# Patient Record
Sex: Male | Born: 1990 | Hispanic: Yes | Marital: Single | Smoking: Former smoker
Health system: Southern US, Community
[De-identification: ages and names within clinical notes are randomized; demographics above are authoritative.]

---
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
## 2010-12-21 LAB — DIFFERENTIAL
Basophils Absolute: 0.1
Basophils Relative: 1
Eosinophils Absolute: 0.2
Lymphocytes Relative: 31
Monocytes Absolute: 0.6
Neutrophils Relative %: 60

## 2010-12-21 LAB — I-STAT 8, (EC8 V) (CONVERTED LAB)
Acid-Base Excess: 3 — ABNORMAL HIGH
Glucose, Bld: 88
Hemoglobin: 16
Potassium: 3.8
TCO2: 30
pCO2, Ven: 49.4

## 2010-12-21 LAB — COMPREHENSIVE METABOLIC PANEL
ALT: 19
Alkaline Phosphatase: 63
BUN: 4 — ABNORMAL LOW
CO2: 30
Chloride: 104
Glucose, Bld: 91
Potassium: 3.8
Sodium: 140
Total Protein: 6.9

## 2010-12-21 LAB — URINALYSIS, ROUTINE W REFLEX MICROSCOPIC
Bilirubin Urine: NEGATIVE
Protein, ur: NEGATIVE
pH: 8

## 2010-12-21 LAB — CBC
MCHC: 34
Platelets: 257

## 2010-12-21 LAB — POCT I-STAT CREATININE
Creatinine, Ser: 0.9
Operator id: 261381

## 2010-12-21 LAB — URINE CULTURE

## 2011-06-05 ENCOUNTER — Ambulatory Visit: Payer: Self-pay

## 2013-04-03 ENCOUNTER — Ambulatory Visit (INDEPENDENT_AMBULATORY_CARE_PROVIDER_SITE_OTHER): Payer: BC Managed Care – PPO | Admitting: Family Medicine

## 2013-04-03 ENCOUNTER — Ambulatory Visit: Payer: BC Managed Care – PPO

## 2013-04-03 VITALS — BP 130/68 | HR 82 | Temp 98.0°F | Resp 16 | Ht 68.0 in | Wt 212.0 lb

## 2013-04-03 DIAGNOSIS — M25562 Pain in left knee: Secondary | ICD-10-CM

## 2013-04-03 DIAGNOSIS — M79609 Pain in unspecified limb: Secondary | ICD-10-CM

## 2013-04-03 DIAGNOSIS — M79645 Pain in left finger(s): Secondary | ICD-10-CM

## 2013-04-03 DIAGNOSIS — M25512 Pain in left shoulder: Secondary | ICD-10-CM

## 2013-04-03 DIAGNOSIS — M67919 Unspecified disorder of synovium and tendon, unspecified shoulder: Secondary | ICD-10-CM

## 2013-04-03 DIAGNOSIS — M25569 Pain in unspecified knee: Secondary | ICD-10-CM

## 2013-04-03 DIAGNOSIS — M25519 Pain in unspecified shoulder: Secondary | ICD-10-CM

## 2013-04-03 DIAGNOSIS — M7582 Other shoulder lesions, left shoulder: Secondary | ICD-10-CM

## 2013-04-03 DIAGNOSIS — M719 Bursopathy, unspecified: Secondary | ICD-10-CM

## 2013-04-03 LAB — POCT CBC
GRANULOCYTE PERCENT: 64.8 % (ref 37–80)
HCT, POC: 50.1 % (ref 43.5–53.7)
HEMOGLOBIN: 15.9 g/dL (ref 14.1–18.1)
LYMPH, POC: 2.8 (ref 0.6–3.4)
MCH: 31.7 pg — AB (ref 27–31.2)
MCHC: 31.7 g/dL — AB (ref 31.8–35.4)
MCV: 100 fL — AB (ref 80–97)
MID (cbc): 0.6 (ref 0–0.9)
MPV: 8.6 fL (ref 0–99.8)
PLATELET COUNT, POC: 301 10*3/uL (ref 142–424)
POC Granulocyte: 6.3 (ref 2–6.9)
POC LYMPH %: 28.9 % (ref 10–50)
POC MID %: 6.3 %M (ref 0–12)
RBC: 5.01 M/uL (ref 4.69–6.13)
RDW, POC: 15.1 %
WBC: 9.7 10*3/uL (ref 4.6–10.2)

## 2013-04-03 MED ORDER — MELOXICAM 7.5 MG PO TABS: 7.5000 mg | ORAL_TABLET | Freq: Every day | ORAL | Status: DC

## 2013-04-03 MED ORDER — TRAMADOL HCL 50 MG PO TABS: 50.0000 mg | ORAL_TABLET | Freq: Three times a day (TID) | ORAL | Status: DC | PRN

## 2013-04-03 NOTE — Patient Instructions (Addendum)
Rest and use ice for your knee and shoulder.   If possible take the next week off of work.  Avoid any overhead maneuvers while lifting weights or working.  Try to do internal and external rotation exercises with your exercise band a couple of times a day.   Use the splint for your thumb when possible.  If your knee is not better in the next couple of days or if it is getting worse please let us know right away Take mobic as needed for inflammation and tramadol as needed for pain.

## 2013-04-03 NOTE — Progress Notes (Signed)
[facility] [address] [place] [address] [phone]  Date:  04/03/2013   Name:  Terry Barber   DOB:  11-Feb-1991   MRN:  [id number]  PCP:  No primary provider on file.    Chief Complaint: Shoulder Pain, Hand Pain and Knee Pain   History of Present Illness:  Terry Barber is a 23 y.o. very pleasant male patient who presents with the following:  Here today as a new patient with some MSK issues.  He has noted left knee pain for "a long time," worse for the last 6 months or so.   He has been out of town a lot for work so it is hard for him to seek medical attention.  He lives in [place] but is rarely here.   He is working out at [employer] and [employer]- this seems to make his knee worse.   He has lost about 30 lbs and hoped this would help- it did for awhile but then his sx came back.  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 Has to work in awkward positions.  He did not have any particular injury except when he hyperextended his knee in HS- it got better on it's own He has not noted and fever or flu like sx, no injection to the knee, no heat or redness.   He also notes a problem with his left shoulder.  He thinks this stems from a HS football injury.  He has pain when he does bench press and this can radiate down his arm.  Overhead press is painful. He hurts worse after doing chest and shoulder weightlifting sessions, or when doing overhead work at this job.  He has has this shoulder issue for about 2 months.    He also has a left thumb injury. He was using a drill when the drill caught and jerked his thumb.  It seemed to injure one of the extensor tendons of his thumb.  This happened 6 months ago. The thumb did swell but he was able to move it ok.  It is now a lot better but still sore with certain movements   He is generally healthy.   He has tried ibuprofen for his injuries.    There are no  active problems to display for this patient.   History reviewed. No pertinent past medical history.  History reviewed. No pertinent past surgical history.  History  Substance Use Topics  . Smoking status: Never Smoker   . Smokeless tobacco: Not on file  . Alcohol Use: No    History reviewed. No pertinent family history.  No Known Allergies  Medication list has been reviewed and updated.  No current outpatient prescriptions on file prior to visit.   No current facility-administered medications on file prior to visit.    Review of Systems:  As per HPI- otherwise negative.   Physical Examination: Filed Vitals:   04/03/13 1734  BP: 130/68  Pulse: 82  Temp: 98 F (36.7 C)  Resp: 16   Filed Vitals:   04/03/13 1734  Height: 5\' 8"  (1.727 m)  Weight: 212 lb (96.163 kg)   Body mass index is 32.24 kg/(m^2). Ideal Body Weight: Weight in (lb) to have BMI = 25: 164.1  GEN: WDWN, NAD, Non-toxic, A & O x 3, muscular build, looks well HEENT: Atraumatic, Normocephalic. Neck supple. No masses, No LAD. Ears and Nose: No  external deformity. CV: RRR, No M/G/R. No JVD. No thrill. No extra heart sounds. PULM: CTA B, no wheezes, crackles, rhonchi. No retractions. No resp. distress. No accessory muscle use. EXTR: No c/c/e NEURO Normal gait.  PSYCH: Normally interactive. Conversant. Not depressed or anxious appearing.  Calm demeanor.  Left knee: full ROM.  No effusion, ligaments are stable. At first exam knee was slightly warm, but pt admitted he had been rubbing and working on the knee to try to help the pain.  Rechecked after films done and warmth resolved.   Left shoulder: full ROM.  Pain with external and internal rotation and with abduction. No weakness.  Negative empty can test Left thumb: no appreciable swelling,  Normal strength and ROM.  He is slightly tender over the MCP joint  [facility] reading (PRIMARY) by  Dr. Patsy Lager. Left hand: negative Left shoulder: negative Left knee:  negative  LEFT KNEE - COMPLETE 4+ VIEW  COMPARISON: None.  FINDINGS: There is no evidence of fracture, dislocation, or joint effusion. There is no evidence of arthropathy or other focal bone abnormality. Soft tissues are unremarkable.  IMPRESSION: Negative.  LEFT SHOULDER - 2+ VIEW  COMPARISON: None.  FINDINGS: There is no evidence of fracture or dislocation. There is no evidence of arthropathy or other focal bone abnormality. Soft tissues are unremarkable. IMPRESSION:  Negative.  EXAM: LEFT HAND - COMPLETE 3+ VIEW  COMPARISON: None.  FINDINGS: There is no evidence of fracture or dislocation. There is no evidence of arthropathy or other focal bone abnormality. Soft tissues are unremarkable.  IMPRESSION: Negative.   Results for orders placed in visit on 04/03/13  POCT CBC      Result Value Range   WBC 9.7  4.6 - 10.2 K/uL   Lymph, poc 2.8  0.6 - 3.4   POC LYMPH PERCENT 28.9  10 - 50 %L   MID (cbc) 0.6  0 - 0.9   POC MID % 6.3  0 - 12 %M   POC Granulocyte 6.3  2 - 6.9   Granulocyte percent 64.8  37 - 80 %G   RBC 5.01  4.69 - 6.13 M/uL   Hemoglobin 15.9  14.1 - 18.1 g/dL   HCT, POC 16.1  09.6 - 53.7 %   MCV 100.0 (*) 80 - 97 fL   MCH, POC 31.7 (*) 27 - 31.2 pg   MCHC 31.7 (*) 31.8 - 35.4 g/dL   RDW, POC 04.5     Platelet Count, POC 301  142 - 424 K/uL   MPV 8.6  0 - 99.8 fL    Assessment and Plan: Pain in left knee - Plan: DG Knee Complete 4 Views Left, POCT CBC, Uric Acid, meloxicam (MOBIC) 7.5 MG tablet, traMADol (ULTRAM) 50 MG tablet  Pain of left thumb - Plan: DG Hand Complete Left  Pain in left shoulder - Plan: DG Shoulder Left  Tendonitis of left rotator cuff  Terry Barber is here today with a few MSK issues.  He seems to have overuse injuries of his left knee and left shoulder, and a thumb sprain that is now improved. Thumb spica splint to left thumb Hinged knee brace Discussed movements to avoid to protect his shoulder, RC strengthening  exercises.   As he plans to stay in town next week he would like to see ortho.  I will arrange for his.   Cautioned regarding his knee: at this time I do not believe he has a joint infection.  However if heat returns, the  pain gets worse or he has any fever or flu- like sx he needs to seek care immediately  mobic and tramadol as needed  Signed Abbe AmsterdamJessica Catlin Aycock, MD

## 2013-04-04 LAB — URIC ACID: Uric Acid, Serum: 4.6 mg/dL (ref 4.0–7.8)

## 2013-04-06 ENCOUNTER — Telehealth: Payer: Self-pay | Admitting: *Deleted

## 2013-04-06 DIAGNOSIS — M25562 Pain in left knee: Secondary | ICD-10-CM

## 2013-04-06 NOTE — Telephone Encounter (Signed)
I have called [facility] and they can see patient on Wed morning at 10:45 , have left message for him to call me back, to you FYI in case you get the call.

## 2013-07-31 ENCOUNTER — Ambulatory Visit (HOSPITAL_COMMUNITY): Payer: Self-pay | Attending: Internal Medicine

## 2013-07-31 ENCOUNTER — Ambulatory Visit (INDEPENDENT_AMBULATORY_CARE_PROVIDER_SITE_OTHER): Payer: BC Managed Care – PPO | Admitting: Internal Medicine

## 2013-07-31 VITALS — BP 124/76 | HR 86 | Temp 98.4°F | Resp 17 | Ht 68.5 in | Wt 218.0 lb

## 2013-07-31 DIAGNOSIS — N508 Other specified disorders of male genital organs: Secondary | ICD-10-CM

## 2013-07-31 DIAGNOSIS — R059 Cough, unspecified: Secondary | ICD-10-CM

## 2013-07-31 DIAGNOSIS — N5089 Other specified disorders of the male genital organs: Secondary | ICD-10-CM

## 2013-07-31 DIAGNOSIS — J45909 Unspecified asthma, uncomplicated: Secondary | ICD-10-CM

## 2013-07-31 DIAGNOSIS — R05 Cough: Secondary | ICD-10-CM

## 2013-07-31 MED ORDER — PREDNISONE 20 MG PO TABS: ORAL_TABLET | ORAL | Status: DC

## 2013-07-31 MED ORDER — CETIRIZINE HCL 10 MG PO TABS: 10.0000 mg | ORAL_TABLET | Freq: Every day | ORAL | Status: DC

## 2013-07-31 NOTE — Progress Notes (Signed)
Subjective:    Patient ID: Terry Barber, male    DOB: 11/18/1990, 23 y.o.   MRN: [id number] This chart was scribed for Terry Sia, MD by Valera Castle, ED Scribe. This patient was seen in room 05 and the patient's care was started at 3:38 PM.  Chief Complaint  Patient presents with  . Testicle Pain   HPI [name] is a 23 y.o. male Pt presents with right testicle pain.   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 He reports going to a MD, states he was not diagnosed with a hernia, had a normal urinalysis , and He reports being put on an antibiotic and Naproxen. He states the swelling has gone down, but he then noticed a lump while palpating his testicle. He states the lump has subsided some since then, but states the lump is still present and still tender. He denies h/o hernia. There is no dysuria or frequency and no penile discharge. He has no new partners.  He reports cough, with associated itching eyes and rhinorrhea, onset 3 weeks ago. He denies h/o asthma and allergies. He reports he works in an area with a lot of dust floating around.    PCP - No PCP Per Patient  There are no active problems to display for this patient.  Prior to Admission medications   Not on File   Review of Systems  Constitutional: Negative for fever.  HENT: Positive for rhinorrhea.   Eyes: Positive for itching.  Respiratory: Positive for cough.   Genitourinary: Positive for scrotal swelling and testicular pain (right).      Objective:   Physical Exam  Nursing note and vitals reviewed. Constitutional: He is oriented to person, place, and time. He appears well-developed and well-nourished. No distress.  HENT:  Head: Normocephalic and atraumatic.  Eyes: Conjunctivae and EOM are normal. Pupils are equal, round, and reactive to light.  Neck: Neck supple. No thyromegaly present.  Cardiovascular: Normal rate, regular  rhythm and normal heart sounds.   No murmur heard. Pulmonary/Chest: Effort normal. No respiratory distress. He has wheezes. He has no rales.  Bilateral wheezing on forced expiration.   Genitourinary:  No inguinal hernia right or left. Right testicle has firm mass occupying lower 1/3. And a small enlargement of the epididymal head with some tenderness.   Musculoskeletal: Normal range of motion.  Lymphadenopathy:    He has no cervical adenopathy.  Neurological: He is alert and oriented to person, place, and time.  Skin: Skin is warm and dry.  Psychiatric: He has a normal mood and affect. His behavior is normal.   BP 124/76  Pulse 86  Temp(Src) 98.4 F (36.9 C) (Oral)  Resp 17  Ht 5' 8.5" (1.74 m)  Wt 218 lb (98.884 kg)  BMI 32.66 kg/m2  SpO2 97%     Assessment & Plan:  Testicular mass - Plan: US Scrotum  Center record [facility] long for this procedure  Cough secondary to Reactive airway disease/allergic rhinitis  Recheck after medications Meds ordered this encounter  Medications  . predniSONE (DELTASONE) 20 MG tablet    Sig: 3/3/2/2/1/1 single daily dose for 6 days    Dispense:  12 tablet    Refill:  0  . cetirizine (ZYRTEC) 10 MG tablet    Sig: Take 1 tablet (10 mg total) by mouth daily.    Dispense:  30 tablet    Refill:  0  I have completed the patient encounter in its entirety as documented by the scribe, with editing by me where necessary. Esta Carmon P. Barber Pastor, M.D.

## 2013-08-03 ENCOUNTER — Telehealth: Payer: Self-pay | Admitting: Family Medicine

## 2013-08-03 NOTE — Telephone Encounter (Signed)
I tried to call patient. His home number is not working and his cell number has a voice mail that has not been set up.  I was unable to reach him.

## 2013-08-03 NOTE — Telephone Encounter (Signed)
Message copied by Honor Loh on Mon Aug 03, 2013  9:14 AM ------      Message from: Tonye Pearson      Created: Sat Aug 01, 2013  2:35 PM       I don't see results of his testicular Llana Aliment may not have gone the [place] long as directed--- would you call and follow up on this tomorrow ------

## 2014-10-27 ENCOUNTER — Emergency Department (HOSPITAL_COMMUNITY)
Admission: EM | Admit: 2014-10-27 | Discharge: 2014-10-27 | Disposition: A | Discharge status: Home / Self Care | Payer: BLUE CROSS/BLUE SHIELD | Attending: Emergency Medicine | Admitting: Emergency Medicine

## 2014-10-27 ENCOUNTER — Encounter (HOSPITAL_COMMUNITY): Payer: Self-pay | Admitting: Emergency Medicine

## 2014-10-27 DIAGNOSIS — T401X1A Poisoning by heroin, accidental (unintentional), initial encounter: Secondary | ICD-10-CM | POA: Diagnosis present

## 2014-10-27 DIAGNOSIS — Z720 Tobacco use: Secondary | ICD-10-CM | POA: Insufficient documentation

## 2014-10-27 DIAGNOSIS — F1110 Opioid abuse, uncomplicated: Secondary | ICD-10-CM | POA: Diagnosis not present

## 2014-10-27 DIAGNOSIS — Y998 Other external cause status: Secondary | ICD-10-CM | POA: Insufficient documentation

## 2014-10-27 DIAGNOSIS — Y9389 Activity, other specified: Secondary | ICD-10-CM | POA: Diagnosis not present

## 2014-10-27 DIAGNOSIS — Y9289 Other specified places as the place of occurrence of the external cause: Secondary | ICD-10-CM | POA: Diagnosis not present

## 2014-10-27 NOTE — ED Notes (Signed)
Pt presents for heroin OD at approximately 1830. EMS gave narcan intranasally. Pt refused EMS transport. Pt c/o HA. Ambulatory upon arrival.

## 2014-10-27 NOTE — ED Provider Notes (Signed)
CSN: [id number]     Arrival date & time 10/27/14  2102 History   First MD Initiated Contact with Patient 10/27/14 2115     No chief complaint on file.    (Consider location/radiation/quality/duration/timing/severity/associated sxs/prior Treatment) Patient is a 24 y.o. male presenting with Overdose. The history is provided by the patient.  Drug Overdose This is a new problem. The current episode started 3 to 5 hours ago. The problem occurs constantly. The problem has been resolved. Pertinent negatives include no chest pain and no abdominal pain. Nothing aggravates the symptoms. Nothing relieves the symptoms. He has tried nothing for the symptoms. The treatment provided no relief.    History reviewed. No pertinent past medical history. History reviewed. No pertinent past surgical history. No family history on file. Social History  Substance Use Topics  . Smoking status: Current Every Day Smoker -- 0.20 packs/day for 1 years    Types: Cigarettes  . Smokeless tobacco: None  . Alcohol Use: No    Review of Systems  Cardiovascular: Negative for chest pain.  Gastrointestinal: Negative for abdominal pain.  All other systems reviewed and are negative.     Allergies  Review of patient's allergies indicates no known allergies.  Home Medications   Prior to Admission medications   Medication Sig Start Date End Date Taking? Authorizing Provider  cetirizine (ZYRTEC) 10 MG tablet Take 1 tablet (10 mg total) by mouth daily. Patient not taking: Reported on 10/27/2014 07/31/13   Tonye Pearson, MD  predniSONE (DELTASONE) 20 MG tablet 3/3/2/2/1/1 single daily dose for 6 days Patient not taking: Reported on 10/27/2014 07/31/13   Tonye Pearson, MD   BP 137/86 mmHg  Pulse 102  Temp(Src) 98.4 F (36.9 C) (Oral)  SpO2 98% Physical Exam  Constitutional: He is oriented to person, place, and time. He appears well-developed and well-nourished. No distress.  HENT:  Head: Normocephalic and  atraumatic.  Eyes: Conjunctivae are normal.  Neck: Neck supple. No tracheal deviation present.  Cardiovascular: Normal rate and regular rhythm.   Pulmonary/Chest: Effort normal. No respiratory distress.  Abdominal: Soft. He exhibits no distension.  Neurological: He is alert and oriented to person, place, and time.  Skin: Skin is warm and dry.  Psychiatric: He has a normal mood and affect.    ED Course  Procedures (including critical care time) Labs Review Labs Reviewed - No data to display  Imaging Review No results found. I have personally reviewed and evaluated these images and lab results as part of my medical decision-making.   EKG Interpretation   Date/Time:  Wednesday October 27 2014 21:46:58 EDT Ventricular Rate:  92 PR Interval:  176 QRS Duration: 89 QT Interval:  362 QTC Calculation: 448 R Axis:   90 Text Interpretation:  Sinus rhythm Early repolarization (normal variant)  Confirmed by Ace Bergfeld MD, Reuel Boom ([id number]) on 10/27/2014 10:06:54 PM      MDM   Final diagnoses:  Heroin abuse    24 year old male presents after having a heroin overdose and being found by EMS, given Narcan in the field with recovery of his symptoms. He was told that he had an abnormal finding on his EKG and that he was recommended to come to the hospital but refused at that time. After a few hours of deliberation he presents for an EKG check. EKG interpreted by me without ST or T wave changes concerning for myocardial ischemia. No delta wave, no prolonged QTc, no brugada to suggest arrhythmogenicity.  He has a normal variant  EKG ST and T-wave finding. I explained that it is more concerning that he is abusing heroin and recommended treatment. Plan to follow up with PCP as needed and return precautions discussed for worsening or new concerning symptoms.     Lyndal Pulley, MD 10/28/14 [phone]

## 2014-10-27 NOTE — Discharge Instructions (Signed)
Emergency Department Resource Guide 1) Find a Doctor and Pay Out of Pocket Although you won't have to find out who is covered by your insurance plan, it is a good idea to ask around and get recommendations. You will then need to call the office and see if the doctor you have chosen will accept you as a new patient and what types of options they offer for patients who are self-pay. Some doctors offer discounts or will set up payment plans for their patients who do not have insurance, but you will need to ask so you aren't surprised when you get to your appointment.  2) Contact Your Local Health Department Not all health departments have doctors that can see patients for sick visits, but many do, so it is worth a call to see if yours does. If you don't know where your local health department is, you can check in your phone book. The CDC also has a tool to help you locate your state's health department, and many state websites also have listings of all of their local health departments.  3) Find a Walk-in Clinic If your illness is not likely to be very severe or complicated, you may want to try a walk in clinic. These are popping up all over the country in pharmacies, drugstores, and shopping centers. They're usually staffed by nurse practitioners or physician assistants that have been trained to treat common illnesses and complaints. They're usually fairly quick and inexpensive. However, if you have serious medical issues or chronic medical problems, these are probably not your best option.  No Primary Care Doctor: - Call Health Connect at  [phone] - they can help you locate a primary care doctor that  accepts your insurance, provides certain services, etc. - Physician Referral Service- [phone]   Behavioral Health Resources in the Community: Intensive Outpatient Programs Organization         Address  Phone  Notes  [facility] [address], [place], [place]  [phone]   [facility] [address] [place], [place] [phone]   [facility] [address] [place], [place]  [phone]   [facility] [address]  [place], [place] [phone] or [phone]   Substance Abuse Resources Organization         Address  Phone  Notes  [facility]  [phone]   [facility]  [phone]   [facility]  [phone]   [facility] Flock  ([phone]   Residential & Outpatient Substance Abuse Program  [phone]      Self-Help/Support Groups Organization         Address  Phone             Notes  [facility] [place] - variety of support groups  336- I7437963 Call for more information  Narcotics Anonymous (NA), [facility] [address], [employer] [place]  2 meetings at this location   Statistician         Address  Phone  Notes  [facility] [address],    [place]  [phone]   [facility]  [address] [place] [address], [place], [place] [phone]   [facility] [address] [place], [place] [phone] Admissions: 8am-3pm M-F  [facility] [address]    [place], [place] [phone]   [facility] [address] [place], [place] [phone]   [facility] [address],  [place], [place] [phone]   [facility] - Intensive Outpatient [address], [place], [place] [phone]   [facility] [address] [place]  [place], [place] [phone] or [phone]   [facility] ([facility] [address], [place], [place] [phone] Accepts Medicaid  [facility] [address]  [place] [phone] Substance Abuse/Addiction Treatment

## 2015-05-13 ENCOUNTER — Ambulatory Visit (INDEPENDENT_AMBULATORY_CARE_PROVIDER_SITE_OTHER): Payer: BLUE CROSS/BLUE SHIELD | Admitting: Physician Assistant

## 2015-05-13 VITALS — BP 114/62 | HR 73 | Temp 98.0°F | Resp 16 | Ht 68.0 in | Wt 224.0 lb

## 2015-05-13 DIAGNOSIS — M25562 Pain in left knee: Secondary | ICD-10-CM | POA: Diagnosis not present

## 2015-05-13 MED ORDER — DICLOFENAC SODIUM 75 MG PO TBEC: 75.0000 mg | DELAYED_RELEASE_TABLET | Freq: Two times a day (BID) | ORAL | Status: DC

## 2015-05-13 NOTE — Patient Instructions (Signed)
Called [facility] - Dr Zachery DauerBarnes - [phone] - to schedule an appt for evaluation and discussed on his MRI result

## 2015-05-13 NOTE — Progress Notes (Signed)
   Terry Barber  MRN: [id number] DOB: 09/30/1990  Subjective:  Pt presents to clinic with continued pain in his left knee. He had multiple knee injuries while playing football in school.  He has pain all day everyday - it bothers him while he is sleeping - if the leg is straight he has more pain.  2 years [facility] and he was given an injection in the knee but it did not help, he thinks he got an MRI but he never got the results.  He uses Motrin 400mg  bid (am and then off of work) which does not really help. 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  He feels like his knee is going to give away but never has and it pops a lot.  He has no locking or clicking.  Called and spoke with Clydie [facility]Karen at [facility].  MRI was done but the patient never came into get the results - Dr Zachery DauerBarnes is who he saw.   Patient Active Problem List   Diagnosis Date Noted  . Left knee pain 05/13/2015    No current outpatient prescriptions on file prior to visit.   No current facility-administered medications on file prior to visit.    No Known Allergies  Review of Systems  Musculoskeletal: Negative for gait problem.   Objective:  BP 114/62 mmHg  Pulse 73  Temp(Src) 98 F (36.7 C) (Oral)  Resp 16  Ht 5\' 8"  (1.727 m)  Wt 224 lb (101.606 kg)  BMI 34.07 kg/m2  SpO2 98%  Physical Exam  Constitutional: He is oriented to person, place, and time and well-developed, well-nourished, and in no distress.  HENT:  Head: Normocephalic and atraumatic.  Right Ear: External ear normal.  Left Ear: External ear normal.  Eyes: Conjunctivae are normal.  Neck: Normal range of motion.  Pulmonary/Chest: Effort normal.  Musculoskeletal:       Right knee: Normal.       Left knee: He exhibits normal range of motion, no ecchymosis, no erythema, no LCL laxity, normal patellar mobility, no bony tenderness, normal meniscus ( neg McMurrys) and no MCL laxity. Tenderness found.  Medial joint line (mild) tenderness noted. No lateral joint line, no MCL, no LCL and no patellar tendon tenderness noted.  Neurological: He is alert and oriented to person, place, and time. Gait normal.  Skin: Skin is warm and dry.  Psychiatric: Mood, memory, affect and judgment normal.    Assessment and Plan :  Left knee pain - Plan: diclofenac (VOLTAREN) 75 MG EC tablet, Care order/instruction   Pt will call and schedule an appt with [facility] to get his MRI results.  He has a normal exam today and if his MRI is normal PT should help him.  Terry LennertSarah Jennet Scroggin PA-C  [facility] 05/13/2015 11:04 AM

## 2017-10-09 ENCOUNTER — Encounter (HOSPITAL_BASED_OUTPATIENT_CLINIC_OR_DEPARTMENT_OTHER): Payer: Self-pay

## 2017-10-09 ENCOUNTER — Emergency Department (HOSPITAL_BASED_OUTPATIENT_CLINIC_OR_DEPARTMENT_OTHER)
Admission: EM | Admit: 2017-10-09 | Discharge: 2017-10-09 | Disposition: A | Discharge status: Home / Self Care | Payer: BLUE CROSS/BLUE SHIELD | Attending: Emergency Medicine | Admitting: Emergency Medicine

## 2017-10-09 ENCOUNTER — Other Ambulatory Visit: Payer: Self-pay

## 2017-10-09 DIAGNOSIS — Y929 Unspecified place or not applicable: Secondary | ICD-10-CM | POA: Insufficient documentation

## 2017-10-09 DIAGNOSIS — F17210 Nicotine dependence, cigarettes, uncomplicated: Secondary | ICD-10-CM | POA: Insufficient documentation

## 2017-10-09 DIAGNOSIS — Y998 Other external cause status: Secondary | ICD-10-CM | POA: Insufficient documentation

## 2017-10-09 DIAGNOSIS — S161XXA Strain of muscle, fascia and tendon at neck level, initial encounter: Secondary | ICD-10-CM

## 2017-10-09 DIAGNOSIS — Y33XXXA Other specified events, undetermined intent, initial encounter: Secondary | ICD-10-CM | POA: Insufficient documentation

## 2017-10-09 DIAGNOSIS — Y939 Activity, unspecified: Secondary | ICD-10-CM | POA: Insufficient documentation

## 2017-10-09 MED ORDER — METHOCARBAMOL 500 MG PO TABS: 500.0000 mg | ORAL_TABLET | Freq: Two times a day (BID) | ORAL | 0 refills | Status: AC

## 2017-10-09 MED ORDER — IBUPROFEN 800 MG PO TABS: 800.0000 mg | ORAL_TABLET | Freq: Three times a day (TID) | ORAL | 0 refills | Status: AC | PRN

## 2017-10-09 NOTE — ED Notes (Addendum)
Pain to left side neck and left upper back, pt last took something for pain yesterday

## 2017-10-09 NOTE — ED Notes (Signed)
Pt verbalizes understanding of d/c instructions and denies any further needs at this time. 

## 2017-10-09 NOTE — ED Provider Notes (Signed)
[facility] EMERGENCY DEPARTMENT Provider Note   CSN: [id number] Arrival date & time: 10/09/17  2113     History   Chief Complaint Chief Complaint  Patient presents with  . Neck Pain    HPI Terry Barber is a 27 y.o. male.  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  He is here today with posterior neck pain on the left side that is been going on for 3 days.  He works [employer] but does not recall any particular injury.  He is tried ibuprofen for this pain but it has not helped.  He is concerned that it will happen like the last time or although once his arm does not work.  His prior injury was on the right side which is different than where his pain is today.  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  The history is provided by the patient.  Neck Pain   This is a new problem. The current episode started more than 2 days ago. The problem occurs constantly. The problem has not changed since onset.The pain is associated with an unknown factor. There has been no fever. The pain is present in the left side. The quality of the pain is described as stabbing. The pain radiates to the left scapula. The pain is moderate. The symptoms are aggravated by twisting and position. The pain is the same all the time. Pertinent negatives include no photophobia, no visual change, no chest pain, no numbness, no headaches, no bowel incontinence, no bladder incontinence, no leg pain, no paresis, no tingling and no weakness. He has tried NSAIDs for the symptoms. The treatment provided no relief.    History reviewed. No pertinent past medical history.  Patient Active Problem List   Diagnosis Date Noted  . Left knee pain 05/13/2015    History reviewed. No pertinent surgical  history.      Home Medications    Prior to Admission medications   Medication Sig Start Date End Date Taking? Authorizing Provider  diclofenac (VOLTAREN) 75 MG EC tablet Take 1 tablet (75 mg total) by mouth 2 (two) times daily. 05/13/15   Valarie Cones, Dema Severin, PA-C    Family History No family history on file.  Social History Social History   Tobacco Use  . Smoking status: Current Every Day Smoker    Packs/day: 0.20    Years: 1.00    Pack years: 0.20    Types: Cigarettes  . Smokeless tobacco: Never Used  Substance Use Topics  . Alcohol use: No  . 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   Review of Systems Review of Systems  Constitutional: Negative for chills and fever.  HENT: Negative for ear pain and sore throat.   Eyes: Negative for photophobia, pain and visual disturbance.  Respiratory: Negative for cough and shortness of breath.   Cardiovascular: Negative for chest pain and palpitations.  Gastrointestinal: Negative for abdominal pain, bowel incontinence and vomiting.  Genitourinary: Negative for bladder incontinence, dysuria and hematuria.  Musculoskeletal: Positive for neck pain. Negative for arthralgias and back pain.  Skin: Negative for color change and rash.  Neurological: Negative for tingling, seizures, syncope,  weakness, numbness and headaches.  All other systems reviewed and are negative.    Physical Exam Updated Vital Signs BP 138/86 (BP Location: Left Arm)   Pulse (!) 109   Temp 99.2 F (37.3 C) (Oral)   Resp 18   Ht 5\' 8"  (1.727 m)   Wt 81.1 kg (178 lb 12.7 oz)   SpO2 100%   BMI 27.19 kg/m   Physical Exam  Constitutional: He appears well-developed and well-nourished.  HENT:  Head: Normocephalic and atraumatic.  Eyes: Conjunctivae are normal.  Neck: No tracheal deviation present.  He has very minimal midline pain on his lower cervical and upper thoracic but does have some left paraspinal spasm  and tenderness in through his left trapezius.  He is got some limitations of range of motion in rotation and flexion and extension secondary to pain.  There is no overlying skin changes no crepitus no warmth.  Cardiovascular: Normal rate, regular rhythm, normal heart sounds and intact distal pulses.  Pulmonary/Chest: Effort normal.  Musculoskeletal: Normal range of motion. He exhibits no deformity.  Lymphadenopathy:    He has no cervical adenopathy.  Neurological: He is alert. He has normal strength. No cranial nerve deficit or sensory deficit. Gait normal. GCS eye subscore is 4. GCS verbal subscore is 5. GCS motor subscore is 6.  Skin: Skin is warm and dry.  Psychiatric: He has a normal mood and affect.  Nursing note and vitals reviewed.    ED Treatments / Results  Labs (all labs ordered are listed, but only abnormal results are displayed) Labs Reviewed - No data to display  EKG None  Radiology No results found.  Procedures Procedures (including critical care time)  Medications Ordered in ED Medications - No data to display   Initial Impression / Assessment and Plan / ED Course  I have reviewed the triage vital signs and the nursing notes.  Pertinent labs & imaging results that were available during my care of the patient were reviewed by me and considered in my medical decision making (see chart for details).  Clinical Course as of Oct 10 1699  Wed Oct 09, 2017  2224 Patient is adamant that he is not actively using drugs.  He is nontoxic-appearing.  I explained how I would be treating this differently if you are actively using and he continues to say if he is not.  He clearly has some spasm and tenderness in that area paracervical on the left side.  He is mostly concerned that it would develop into a neurologic symptom.  I think with no mechanism for trauma he can be treated with NSAIDs and muscle relaxants with  the understanding that if he were to have any progression of his  symptoms that he should present to facility that would have an MRI.   [MB]    Clinical Course User Index [MB] Terrilee FilesButler, Charika Mikelson C, MD    Final Clinical Impressions(s) / ED Diagnoses   Final diagnoses:  Acute strain of neck muscle, initial encounter    ED Discharge Orders        Ordered    ibuprofen (ADVIL,MOTRIN) 800 MG tablet  Every 8 hours PRN     10/09/17 2229    methocarbamol (ROBAXIN) 500 MG tablet  2 times daily     10/09/17 2229       Terrilee FilesButler, Tiari Andringa C, MD 10/10/17 1702

## 2017-10-09 NOTE — ED Triage Notes (Addendum)
C/o posterior neck pain x 3 days-denies recent injury-denies fever-pain worse with movement-NAD-steady gait

## 2017-10-09 NOTE — Discharge Instructions (Addendum)
Your evaluated in the emergency department for left-sided neck pain.  This is likely muscular and we are giving you some prescription ibuprofen and a muscle relaxant to help with this.  You can continue to use ice or heat to the area as needed.  If you notice any new weakness or numbness you should be reevaluated.

## 2019-07-06 ENCOUNTER — Emergency Department (HOSPITAL_COMMUNITY): Payer: Self-pay

## 2019-07-06 ENCOUNTER — Other Ambulatory Visit: Payer: Self-pay

## 2019-07-06 ENCOUNTER — Emergency Department (HOSPITAL_COMMUNITY)
Admission: EM | Admit: 2019-07-06 | Discharge: 2019-07-06 | Disposition: A | Discharge status: Home / Self Care | Payer: Self-pay | Attending: Emergency Medicine | Admitting: Emergency Medicine

## 2019-07-06 ENCOUNTER — Encounter (HOSPITAL_COMMUNITY): Payer: Self-pay

## 2019-07-06 DIAGNOSIS — M25562 Pain in left knee: Secondary | ICD-10-CM

## 2019-07-06 DIAGNOSIS — Z87891 Personal history of nicotine dependence: Secondary | ICD-10-CM | POA: Insufficient documentation

## 2019-07-06 DIAGNOSIS — M25461 Effusion, right knee: Secondary | ICD-10-CM | POA: Insufficient documentation

## 2019-07-06 MED ORDER — DICLOFENAC SODIUM 50 MG PO TBEC: 50.0000 mg | DELAYED_RELEASE_TABLET | Freq: Two times a day (BID) | ORAL | 0 refills | Status: AC

## 2019-07-06 NOTE — Discharge Instructions (Signed)
Follow-up with your orthopedic provider, if you do not have a local orthopedic provider, you may contact the referral given. Take diclofenac as prescribed.

## 2019-07-06 NOTE — ED Provider Notes (Signed)
[facility]-EMERGENCY DEPT Provider Note   CSN: [id number] Arrival date & time: 07/06/19  1803     History Chief Complaint  Patient presents with  . Knee Pain    Terry Barber is a 29 y.o. male.  29 year old male presents with complaint of right knee pain and swelling x1 month.  Patient states pain started when he tried to pop his knee, had sudden onset of pain and states that he was unable to bear weight on the leg for period of time.  Patient followed up with orthopedics, was given a knee brace and crutches, had an MRI of his knee, has had his knee tapped as well as a steroid injection without improvement.  Patient states pain is ongoing, not improving with Tylenol, is bothersome at night.  Denies any other joint involvement, redness, rashes, history of gonorrhea, fever. No other complaints or concerns.         History reviewed. No pertinent past medical history.  Patient Active Problem List   Diagnosis Date Noted  . Left knee pain 05/13/2015    History reviewed. No pertinent surgical history.     Family History  Family history unknown: Yes    Social History   Tobacco Use  . Smoking status: Former Smoker    Packs/day: 0.20    Years: 1.00    Pack years: 0.20    Types: Cigarettes  . Smokeless tobacco: Never Used  Substance Use Topics  . Alcohol use: No  . Drug use: Never    Home Medications Prior to Admission medications   Medication Sig Start Date End Date Taking? Authorizing Provider  diclofenac (VOLTAREN) 50 MG EC tablet Take 1 tablet (50 mg total) by mouth 2 (two) times daily for 10 days. 07/06/19 07/16/19  Jeannie Fend, PA-C  ibuprofen (ADVIL,MOTRIN) 800 MG tablet Take 1 tablet (800 mg total) by mouth every 8 (eight) hours as needed. 10/09/17   Terrilee Files, MD  methocarbamol (ROBAXIN) 500 MG tablet Take 1 tablet (500 mg total) by mouth 2 (two) times daily. 10/09/17   Terrilee Files, MD    Allergies    Patient has no known  allergies.  Review of Systems   Review of Systems  Constitutional: Negative for fever.  Musculoskeletal: Positive for arthralgias, gait problem and joint swelling.  Skin: Negative for color change, rash and wound.  Allergic/Immunologic: Negative for immunocompromised state.  Neurological: Negative for weakness and numbness.  Psychiatric/Behavioral: Positive for sleep disturbance.    Physical Exam Updated Vital Signs BP (!) 141/70   Pulse 97   Temp 99.3 F (37.4 C) (Oral)   Resp 18   Ht 5\' 8"  (1.727 m)   Wt 95.3 kg   SpO2 99%   BMI 31.93 kg/m   Physical Exam Vitals and nursing note reviewed.  Constitutional:      General: He is not in acute distress.    Appearance: He is well-developed. He is not diaphoretic.  HENT:     Head: Normocephalic and atraumatic.  Cardiovascular:     Pulses: Normal pulses.  Pulmonary:     Effort: Pulmonary effort is normal.  Musculoskeletal:        General: Swelling and tenderness present. No deformity.     Right knee: Effusion present. No erythema, ecchymosis, lacerations, bony tenderness or crepitus. Tenderness present over the medial joint line, lateral joint line and patellar tendon. No LCL laxity or MCL laxity.     Comments: Able to flex to 90 degrees,  able to fully extend  Skin:    General: Skin is warm and dry.     Findings: No erythema or rash.  Neurological:     Mental Status: He is alert and oriented to person, place, and time.  Psychiatric:        Behavior: Behavior normal.     ED Results / Procedures / Treatments   Labs (all labs ordered are listed, but only abnormal results are displayed) Labs Reviewed - No data to display  EKG None  Radiology DG Knee Complete 4 Views Right  Result Date: 07/06/2019 CLINICAL DATA:  Right-sided knee pain for 1 month EXAM: RIGHT KNEE - COMPLETE 4+ VIEW COMPARISON:  None. FINDINGS: Small joint effusion is noted. No acute fracture or dislocation is seen. No significant degenerative changes  are noted. No soft tissue abnormality is noted. IMPRESSION: Small joint effusion without acute bony abnormality. Electronically Signed   By: Inez Catalina M.D.   On: 07/06/2019 19:23    Procedures Procedures (including critical care time)  Medications Ordered in ED Medications - No data to display  ED Course  I have reviewed the triage vital signs and the nursing notes.  Pertinent labs & imaging results that were available during my care of the patient were reviewed by me and considered in my medical decision making (see chart for details).  Clinical Course as of Jul 05 2356  Mon Jul 05, 5244  8625 29 year old male presents with complaint of pain in his right knee with swelling for the past month.  Patient denies any injury to this leg, states pain started after he bent his knee to pop it.  Patient states that he has been going to a local orthopedics office, has had fluid drained off of his knee, has had a steroid injection, has had an MRI.  Patient is here today because he continues to have pain and has difficulty sleeping.  I am unable to locate any imaging or office notes in the system, x-ray today shows a small effusion otherwise unremarkable.  There is no redness appreciable, no rashes.  Will give patient diclofenac and recommend he continue with his knee brace and contact his orthopedist for further evaluation.  In regards to his request for medication for sleep, recommend melatonin.   [LM]    Clinical Course User Index [LM] Roque Lias   MDM Rules/Calculators/A&P                      Final Clinical Impression(s) / ED Diagnoses Final diagnoses:  Effusion of right knee    Rx / DC Orders ED Discharge Orders         Ordered    diclofenac (VOLTAREN) 50 MG EC tablet  2 times daily     07/06/19 2133           Tacy Learn, PA-C 07/06/19 2358    Daleen Bo, MD 07/07/19 [phone]

## 2019-07-06 NOTE — ED Notes (Signed)
An After Visit Summary was printed and given to the patient. Discharge instructions given and no further questions at this time.  

## 2019-07-06 NOTE — ED Triage Notes (Signed)
Patient reports that he has right knee pain x 1 month. Patient states he was unable to bear weight until the pasts few days. Patient also reports swelling of the right knee. patien tis wearing a knee immobilizer.

## 2021-01-16 IMAGING — CR DG KNEE COMPLETE 4+V*R*
4 series · 4 of 4 positions shown · non-contrast
Comparison: None.

CLINICAL DATA: Right-sided knee pain for 1 month

EXAM:
RIGHT KNEE - COMPLETE 4+ VIEW

[t knee ap right]
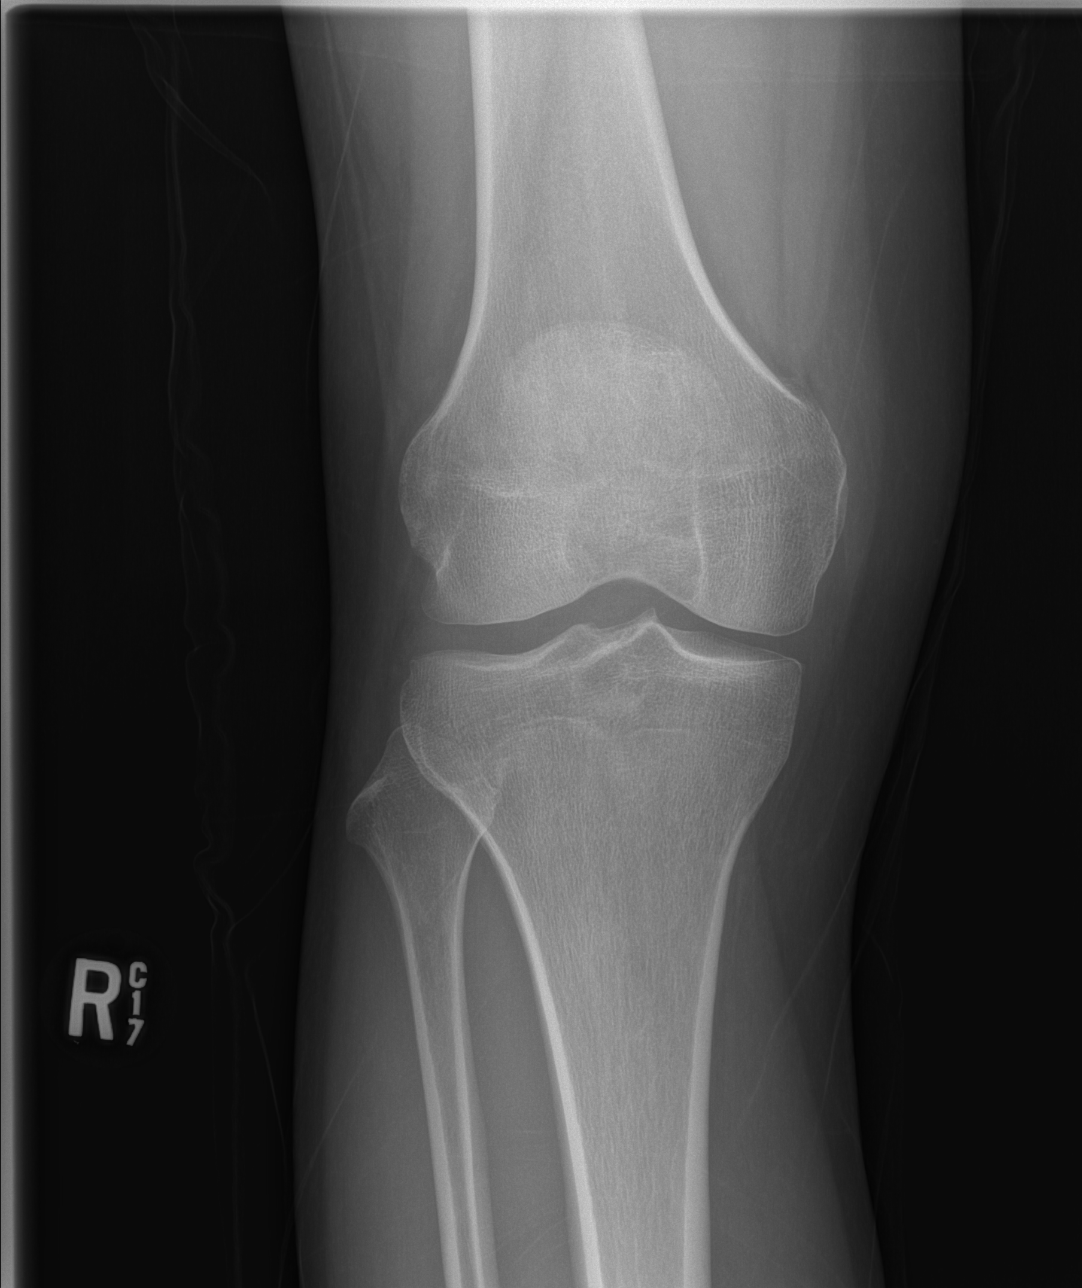

[t knee obl right (1 of 2)]
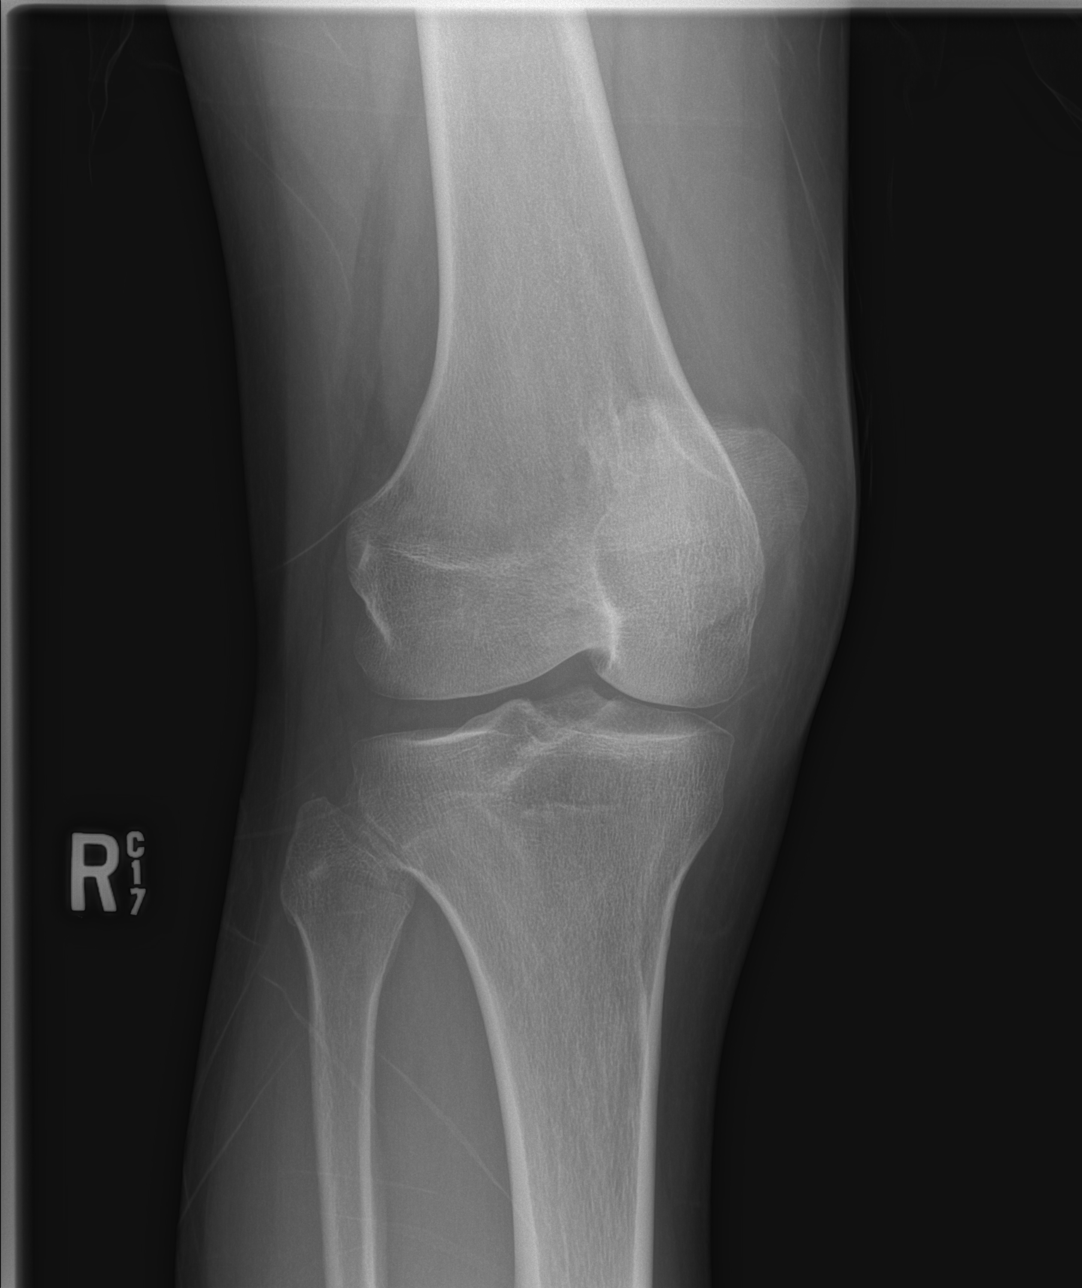

[t knee obl right (2 of 2)]
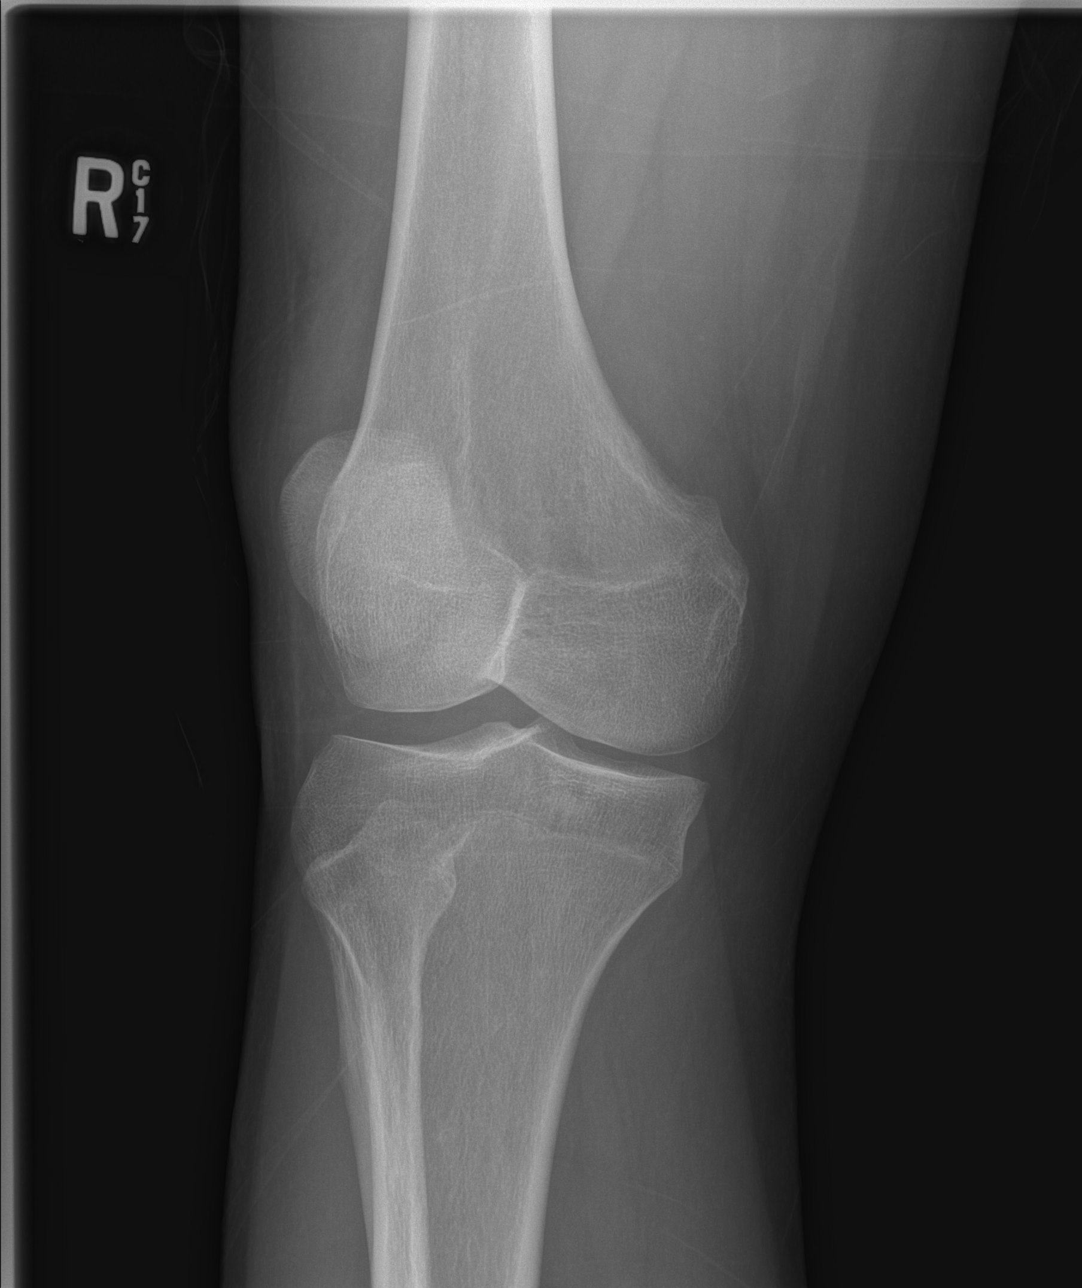

[t knee lat right]
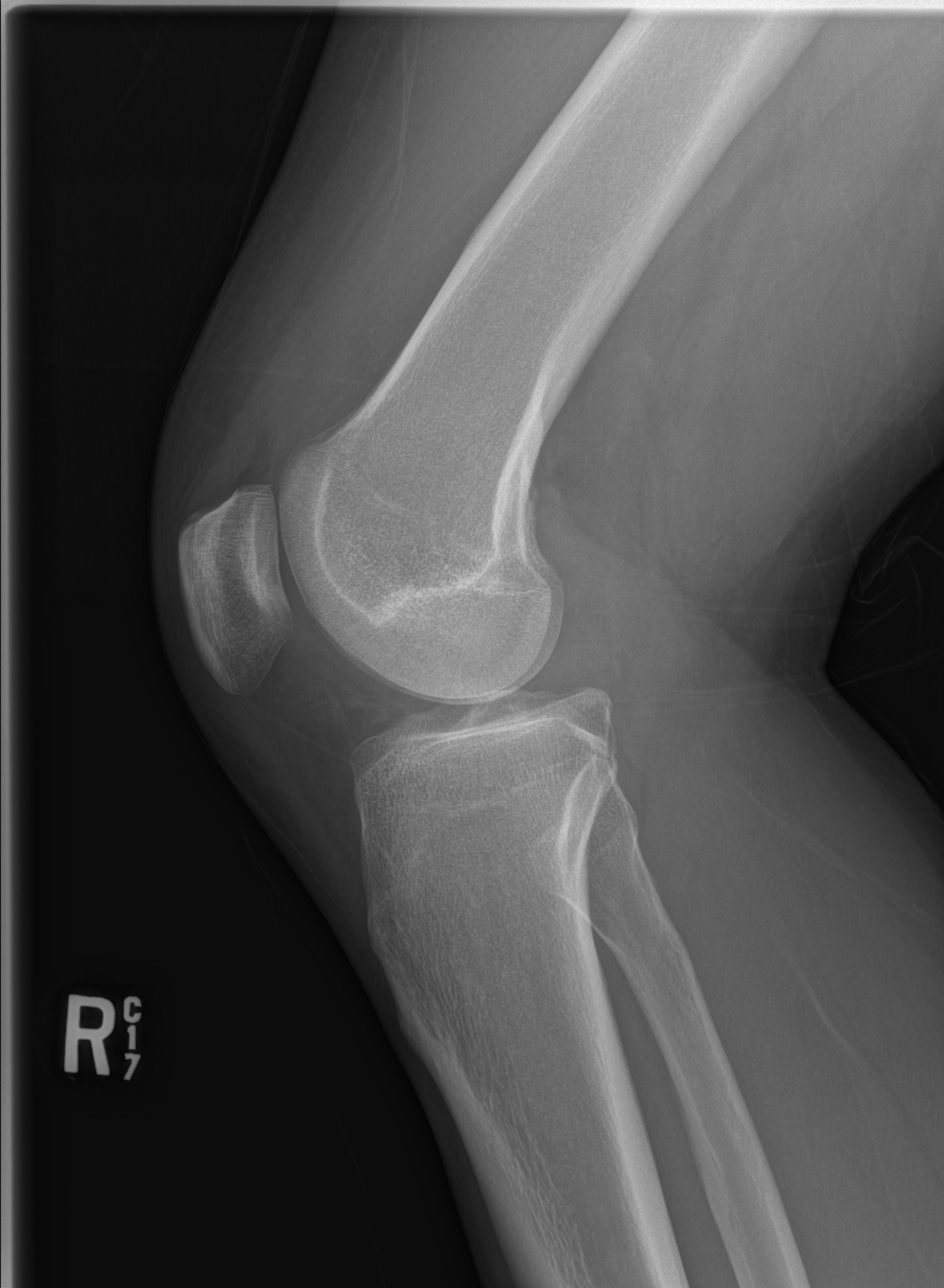

[4 of 4 positions shown; findings below may reference images not displayed]

FINDINGS: Small joint effusion is noted. No acute fracture or dislocation is
seen. No significant degenerative changes are noted. No soft tissue
abnormality is noted.
IMPRESSION: Small joint effusion without acute bony abnormality.
